# Patient Record
Sex: Female | Born: 1970 | Race: White | Hispanic: No | Marital: Single
Health system: Southern US, Community
[De-identification: ages and names within clinical notes are randomized; demographics above are authoritative.]

---

## 2008-05-16 ENCOUNTER — Ambulatory Visit (HOSPITAL_BASED_OUTPATIENT_CLINIC_OR_DEPARTMENT_OTHER): Admission: RE | Admit: 2008-05-16 | Discharge: 2008-05-16 | Payer: Self-pay | Admitting: Urology

## 2010-05-29 LAB — BASIC METABOLIC PANEL
CO2: 29 mEq/L (ref 19–32)
GFR calc Af Amer: >60 mL/min (ref >60)
GFR calc non Af Amer: >60 mL/min (ref >60)
Glucose, Bld: 83 mg/dL (ref 70–99)
Potassium: 3.9 mEq/L (ref 3.5–5.1)
Sodium: 138 mEq/L (ref 135–145)

## 2010-05-29 LAB — URINALYSIS, ROUTINE W REFLEX MICROSCOPIC
Bilirubin Urine: NEGATIVE
Glucose, UA: NEGATIVE mg/dL
Specific Gravity, Urine: 1.011 (ref 1.005–1.030)
Urobilinogen, UA: 0.2 mg/dL (ref 0.0–1.0)

## 2010-05-29 LAB — CBC
HCT: 40.3 % (ref 36.0–46.0)
Hemoglobin: 13.3 g/dL (ref 12.0–15.0)
MCHC: 33 g/dL (ref 30.0–36.0)
RBC: 4.32 MIL/uL (ref 3.87–5.11)
RDW: 13.9 % (ref 11.5–15.5)

## 2010-05-29 LAB — URINE MICROSCOPIC-ADD ON

## 2010-07-01 NOTE — Op Note (Signed)
NAMEDEDRA, Tammy Rowland         ACCOUNT NO.:  [id number]   MEDICAL RECORD NO.:  [id number]          PATIENT TYPE:  AMB   LOCATION:  [facility]                         FACILITY:  [facility]   PHYSICIAN:  Courtney Paris, M.D.DATE OF BIRTH:  June 03, 1970   DATE OF PROCEDURE:  05/16/2008  DATE OF DISCHARGE:                               OPERATIVE REPORT   PREOPERATIVE DIAGNOSIS:  Left distal ureteral stone with obstruction.   POSTOPERATIVE DIAGNOSES:  Left distal ureteral stone with obstruction.   OPERATION:  Cystoscopy, left retrograde pyelogram, ureteroscopy and  stone removal, left ureteral stent insertion.   ANESTHESIA:  General.   SURGEON:  Courtney Paris, M.D.   BRIEF HISTORY:  This 40 year old lady presents with her first stone.  It  was a 7-mm left distal ureteral stone.  This was first presented on  May 13, 2008.  She does have bilateral stones that are nonobstructing  in both kidneys.  She enters now for ureteroscopy and stone extraction,  possible lasertripsy and insertion of stent.   The patient was placed in the dorsal lithotomy position on the operating  table.  After satisfactory induction of general anesthesia, was prepped  and draped with Betadine in the usual sterile fashion.  Intravenous  Cipro was given.  Time-out was then performed and the patient and  procedure then re-confirmed.   A 22 panendoscope was inserted.  The bladder was inspected.  It was free  of any mucosal lesions.  Both orifices looked normal.  The left orifice  was catheterized with a 5 open-ended ureteral catheter and occlusive  retrograde demonstrated the stone with a high-grade obstruction just  beyond this.  The stone was seen to be impacted.  I used a Glidewire to  negotiate this past the stone and then passed the open-ended catheter  beyond the stone.  The Glidewire was then removed and the retrograde  catheter again injected and the upper tracts shown.  There was a stone  in the  upper pole of the left kidney and the kidney was dilated and  hydronephrotic.   Through the open-ended catheter a sensor guidewire was then passed under  fluoroscopy.  The open-ended catheter was then removed.  The distal  ureter was dilated up to the stone with an inner sheath cannula from a  ureteral access sheath under fluoroscopy and held for 5 minutes.  This  was removed, leaving the stent in place.  Using a short ureteroscope, I  was able to get up to and past the stone.  The stone was somewhat  embedded in the wall of the ureter but I was able to get past it with  the scope.  Using a basket, I was able to entrap the stone and pull it  out intact.  A second pass with the scope showed that there was some  hyperemia and erythema of the ureter where the stone was embedded but  the wall was intact.  I thought it best to leave a stent for several  days to allow this to heal.   The scope was removed and a cystoscope then was back-loaded over the  guidewire  and under direct vision and fluoroscopy a 6-French x 26-cm  length double-J ureteral stent was then passed over the guidewire up to  the level of  the kidney.  When the guidewire was removed there was a nice coil in the  renal pelvis, one in the bladder.  The bladder was drained, B and O  suppository inserted, the patient given some IV Toradol.  She was taken  to the recovery room in good condition and I will see her back in a week  to remove the stent in the office at that time.      Courtney Paris, M.D.  Electronically Signed     HMK/MEDQ  D:  05/16/2008  T:  05/16/2008  Job:  [id number]

## 2010-08-27 IMAGING — CR DG ABDOMEN 1V
2 series · 2 of 2 positions shown · non-contrast
Comparison: No priors

CLINICAL DATA: Left ureteral stone - for ureteroscopy

ABDOMEN - 1 VIEW

[t abdomen supine (1 of 2)]
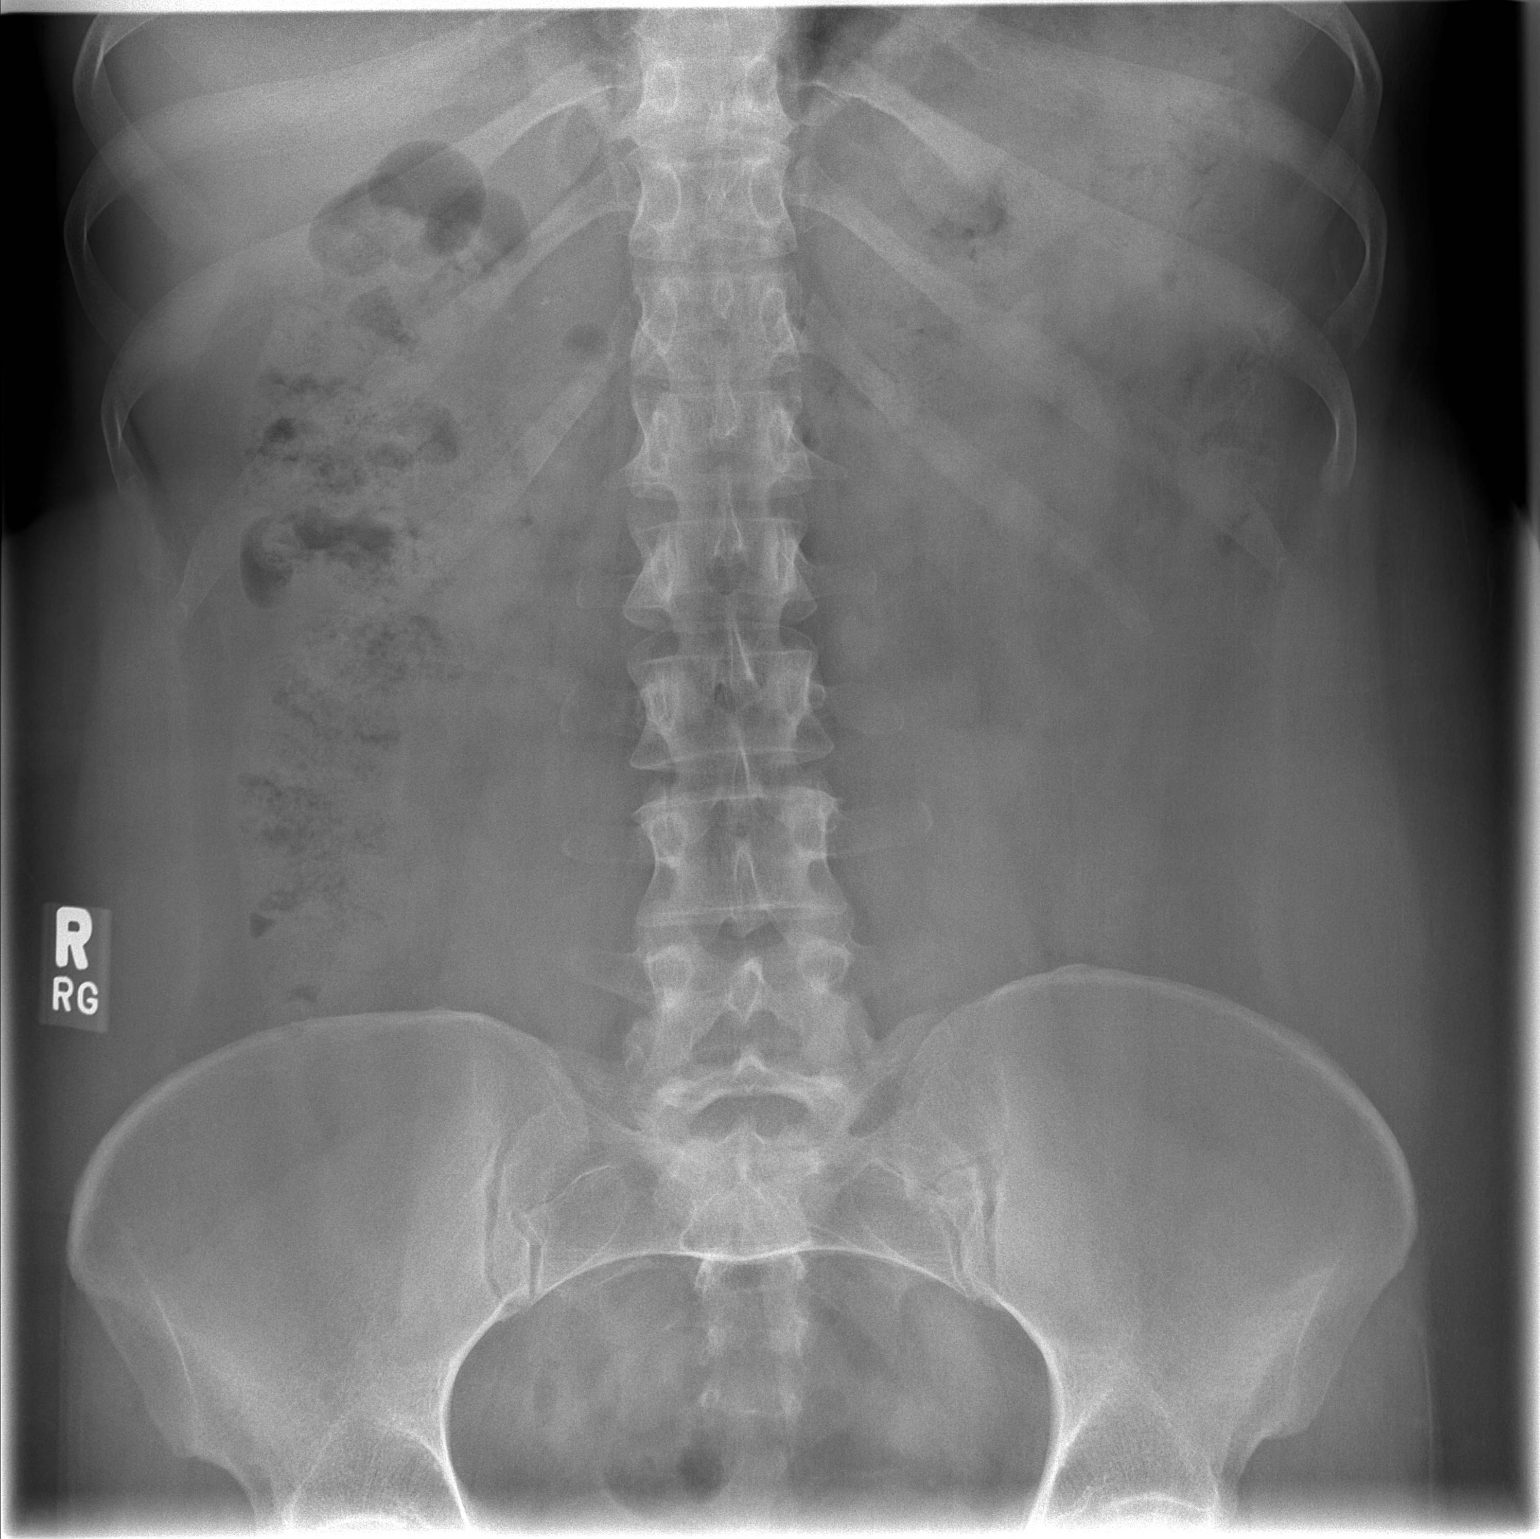

[t abdomen supine (2 of 2)]
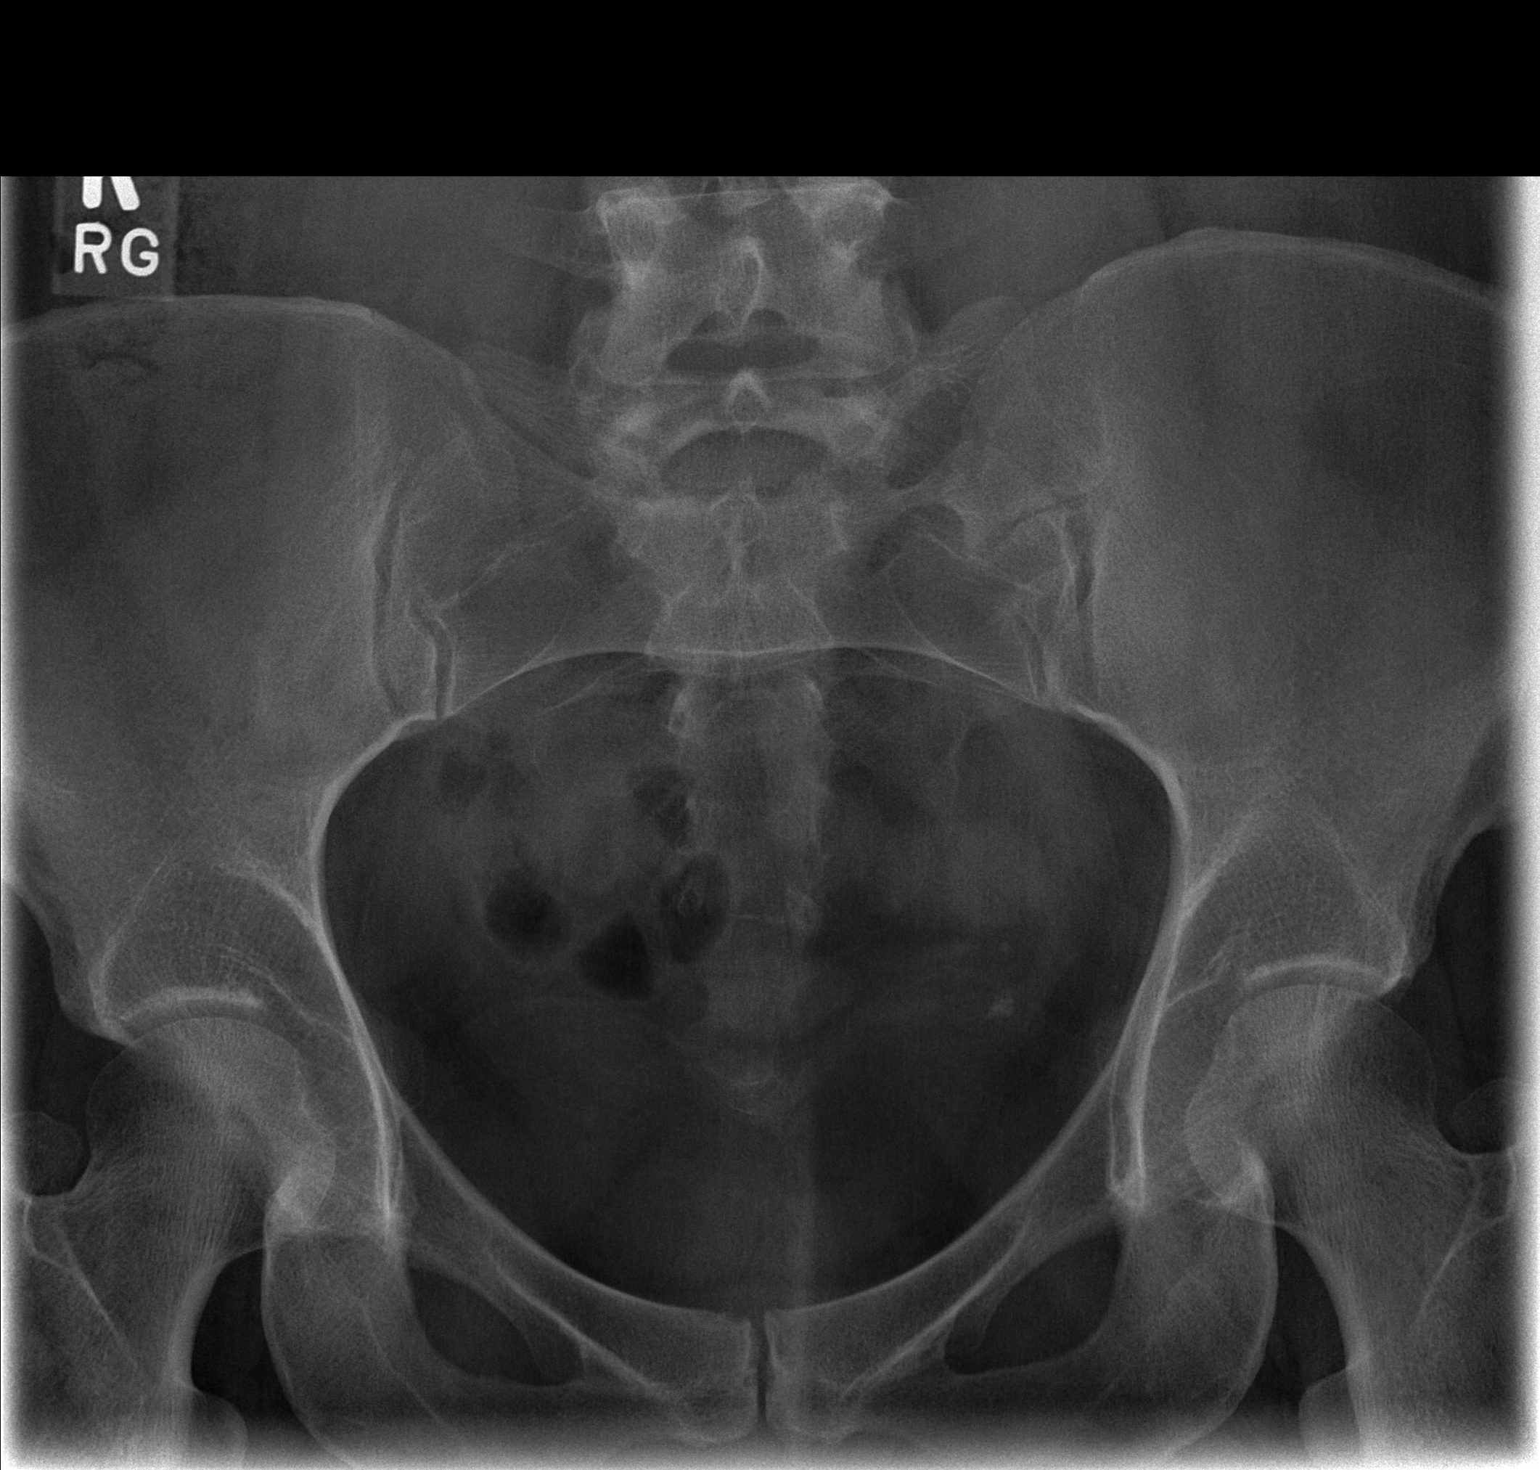

[2 of 2 positions shown; findings below may reference images not displayed]

FINDINGS: There is a triangular shaped density in the left pelvis
measuring approximately 4 x 6 mm.  This is probably a lower
ureteral calculus, but we have no prior studies for comparison.
Possible small right upper renal calculi - otherwise no urinary
tract calcifications noted.
IMPRESSION: 1.  Probable 4 x 6 mm left distal ureteral calculus.
2.  Possible small tiny stones in the upper pole of the right
kidney.

## 2015-08-27 DIAGNOSIS — E039 Hypothyroidism, unspecified: Secondary | ICD-10-CM | POA: Insufficient documentation

## 2017-08-03 DIAGNOSIS — E782 Mixed hyperlipidemia: Secondary | ICD-10-CM | POA: Insufficient documentation

## 2017-08-03 DIAGNOSIS — I10 Essential (primary) hypertension: Secondary | ICD-10-CM | POA: Insufficient documentation

## 2017-12-13 DIAGNOSIS — F419 Anxiety disorder, unspecified: Secondary | ICD-10-CM | POA: Insufficient documentation

## 2018-06-28 DIAGNOSIS — F3342 Major depressive disorder, recurrent, in full remission: Secondary | ICD-10-CM | POA: Insufficient documentation

## 2018-06-28 DIAGNOSIS — F5104 Psychophysiologic insomnia: Secondary | ICD-10-CM | POA: Insufficient documentation

## 2018-09-26 DIAGNOSIS — L719 Rosacea, unspecified: Secondary | ICD-10-CM | POA: Insufficient documentation

## 2018-09-26 DIAGNOSIS — R5381 Other malaise: Secondary | ICD-10-CM | POA: Insufficient documentation

## 2018-09-26 DIAGNOSIS — R5383 Other fatigue: Secondary | ICD-10-CM | POA: Insufficient documentation

## 2018-09-26 DIAGNOSIS — M2550 Pain in unspecified joint: Secondary | ICD-10-CM | POA: Insufficient documentation

## 2020-06-14 DIAGNOSIS — Z1211 Encounter for screening for malignant neoplasm of colon: Secondary | ICD-10-CM | POA: Insufficient documentation

## 2020-06-14 DIAGNOSIS — E559 Vitamin D deficiency, unspecified: Secondary | ICD-10-CM | POA: Insufficient documentation

## 2021-01-28 ENCOUNTER — Ambulatory Visit: Payer: BC Managed Care – PPO | Admitting: Sports Medicine

## 2021-01-28 ENCOUNTER — Encounter: Payer: Self-pay | Admitting: Sports Medicine

## 2021-01-28 DIAGNOSIS — L84 Corns and callosities: Secondary | ICD-10-CM

## 2021-01-28 DIAGNOSIS — M79671 Pain in right foot: Secondary | ICD-10-CM | POA: Diagnosis not present

## 2021-01-28 DIAGNOSIS — L853 Xerosis cutis: Secondary | ICD-10-CM | POA: Diagnosis not present

## 2021-01-28 DIAGNOSIS — M79672 Pain in left foot: Secondary | ICD-10-CM

## 2021-01-28 NOTE — Progress Notes (Signed)
Subjective: Tammy Rowland is a 50 y.o. female patient who presents to office for evaluation of Left>right foot pain secondary to callus skin. Patient complains of pain at the lesions present at the balls of both feet as well as the heels states that the callus lesions are worse on the left.  States that she has tried multiple creams and has been for pedicures but they just filed them down and do not help.  Patient denies any other pedal complaints at this time.  Patient Active Problem List   Diagnosis Date Noted   Screening for colon cancer 06/14/2020   Vitamin D deficiency 06/14/2020   Arthralgia of multiple joints 09/26/2018   Malaise and fatigue 09/26/2018   Rosacea 09/26/2018   Chronic insomnia 06/28/2018   Recurrent major depressive disorder, in full remission (HCC) 06/28/2018   Chronic anxiety 12/13/2017   Essential hypertension 08/03/2017   Mixed dyslipidemia 08/03/2017   Acquired hypothyroidism 08/27/2015    Current Outpatient Medications on File Prior to Visit  Medication Sig Dispense Refill   Fluoxetine HCl, PMDD, 20 MG TABS Take 1 tablet by mouth daily.     liothyronine (CYTOMEL) 5 MCG tablet TAKE 2 TABLETS BY MOUTH EVERY DAY ON AN EMPTY STOMACH     metroNIDAZOLE (METROGEL) 1 % gel Apply topically daily.     nystatin-triamcinolone (MYCOLOG II) cream Apply topically 2 (two) times daily.     Olopatadine HCl 0.2 % SOLN Apply to eye.     amLODipine (NORVASC) 5 MG tablet Take 5 mg by mouth daily.     FLUoxetine (PROZAC) 20 MG tablet Take 20 mg by mouth daily.     LORazepam (ATIVAN) 0.5 MG tablet Take 0.5 mg by mouth every 8 (eight) hours as needed.     pravastatin (PRAVACHOL) 20 MG tablet Take 20 mg by mouth daily as needed.     progesterone (PROMETRIUM) 100 MG capsule Take 100 mg by mouth at bedtime.     spironolactone (ALDACTONE) 25 MG tablet Take 25 mg by mouth daily.     zolpidem (AMBIEN) 10 MG tablet Take 10 mg by mouth at bedtime as needed.     No current  facility-administered medications on file prior to visit.    No Known Allergies  Objective:  General: Alert and oriented x3 in no acute distress  Dermatology: Keratotic lesion present plantar forefoot on the left submet to 3 and 5, right submet 5 and bilateral heels dry skin diffuse with pain is present with direct pressure to the lesion with a central nucleated core noted plantar forefoot, no webspace macerations, no ecchymosis bilateral, all nails x 10 are well manicured.  Vascular: Dorsalis Pedis and Posterior Tibial pedal pulses 2/4, Capillary Fill Time 3 seconds, + pedal hair growth bilateral, no edema bilateral lower extremities, Temperature gradient within normal limits.  Neurology: Michaell Cowing sensation intact via light touch bilateral.  Musculoskeletal: Mild tenderness with palpation at the keratotic lesion site on left greater than right, muscular strength 5/5 in all groups without pain or limitation on range of motion. No lower extremity muscular or boney deformity noted.  Assessment and Plan: Problem List Items Addressed This Visit   None Visit Diagnoses     Callus    -  Primary   Dry skin       Foot pain, bilateral           -Complete examination performed -Discussed treatment options for foot pain related to likely mechanical pressure and callusing -Parred keratoic lesions x5 using a  15 blade -Encouraged daily skin emollients gave sample of foot miracle cream -Encouraged use of pumice stone -Advised good supportive shoes and inserts and advised patient that she should also consider custom functional foot orthotics orthotic estimate form provided -Patient to return to office as needed or sooner if condition worsens.  Advised patient to return if callus lesions and dry skin fails to improve encouraged her to consistently use her foot cream and pumice stone to help if this cream does not help may benefit in the future from getting Revitaderm.  Asencion Islam, DPM

## 2021-01-28 NOTE — Patient Instructions (Signed)
Shoe List ?For tennis shoes recommend:  ?Brooks Beast ?Asics ?New balance ?Saucony ?HOKA ?Can be purchased at [employer] or [employer] ?Sketchers arch fit ?Can be purchased at any major retailers ? ?Vionic  ?SAS ?Can be purchased at [employer] or [employer]  ? ?For work shoes recommend: ?Sketchers Work ?Timberland boots  ?Redwing boots ?Can be purchased at a variety of places or [employer]  ? ?For casual shoes recommend: ?Oofos ?Can be purchased at [employer] or [employer] ?Vionic  ?Can be purchased at [employer] or [employer]  ? ?Orthotics  ?For Over-the-Counter Orthotics recommend: ?Power Steps ?Can be purchased in office/[facility] ?Superfeet ?Can be purchased at [employer] or [employer] ?Airplus ?Can be purchased at [employer] ?For Custom Orthotics recommend: ?Custom fitting/Appointment at [facility] ? ?

## 2021-02-03 ENCOUNTER — Telehealth: Payer: Self-pay | Admitting: Sports Medicine

## 2021-02-03 NOTE — Telephone Encounter (Signed)
Patient called wanting to know what the lotion is that she can get to put on her feet, she was told it was stronger that the foot Miracle? Does she need a prescription for it ?

## 2021-02-03 NOTE — Telephone Encounter (Signed)
LMOM about the Revitaderm .

## 2021-07-15 ENCOUNTER — Encounter: Payer: Self-pay | Admitting: Family Medicine
